# Patient Record
Sex: Female | Born: 1985 | Race: White | Hispanic: No | Marital: Married | Smoking: Never smoker
Health system: Southern US, Community
[De-identification: ages and names within clinical notes are randomized; demographics above are authoritative.]

## PROBLEM LIST (undated history)

## (undated) ENCOUNTER — Inpatient Hospital Stay (HOSPITAL_COMMUNITY): Payer: Self-pay

## (undated) HISTORY — PX: WISDOM TOOTH EXTRACTION: SHX21

---

## 2011-09-01 LAB — OB RESULTS CONSOLE GC/CHLAMYDIA
Chlamydia: NEGATIVE
Gonorrhea: NEGATIVE

## 2011-09-01 LAB — OB RESULTS CONSOLE ANTIBODY SCREEN: Antibody Screen: NEGATIVE

## 2011-09-01 LAB — OB RESULTS CONSOLE RUBELLA ANTIBODY, IGM: Rubella: IMMUNE

## 2011-09-01 LAB — OB RESULTS CONSOLE RPR: RPR: NONREACTIVE

## 2011-09-01 LAB — OB RESULTS CONSOLE HEPATITIS B SURFACE ANTIGEN: Hepatitis B Surface Ag: NEGATIVE

## 2011-09-01 LAB — OB RESULTS CONSOLE HIV ANTIBODY (ROUTINE TESTING): HIV: NONREACTIVE

## 2011-09-29 NOTE — L&D Delivery Note (Signed)
Delivery Note Called for precipitous delivery, pt rolled over and fetal vtx presented.  SVD and placenta attended by faculty.  I did repair.  SVD viable female Apgars 9,9 over 2nd degree lac.  Placenta delivered spontaneously intact with 3VC. Repair with 2-0 Chromic with good support and hemostasis noted and R/V exam confirms.  PH art was not done.  [place] cord blood was not done.  Mother and baby were doing well.  EBL 350cc  Candice Camp, MD

## 2011-12-21 ENCOUNTER — Encounter (HOSPITAL_COMMUNITY): Payer: Self-pay | Admitting: *Deleted

## 2011-12-21 ENCOUNTER — Inpatient Hospital Stay (HOSPITAL_COMMUNITY): Payer: BC Managed Care – PPO

## 2011-12-21 ENCOUNTER — Inpatient Hospital Stay (HOSPITAL_COMMUNITY)
Admission: AD | Admit: 2011-12-21 | Discharge: 2011-12-21 | Disposition: A | Discharge status: Home / Self Care | Payer: BC Managed Care – PPO | Source: Ambulatory Visit | Attending: Obstetrics and Gynecology | Admitting: Obstetrics and Gynecology

## 2011-12-21 DIAGNOSIS — O4690 Antepartum hemorrhage, unspecified, unspecified trimester: Secondary | ICD-10-CM

## 2011-12-21 DIAGNOSIS — O209 Hemorrhage in early pregnancy, unspecified: Secondary | ICD-10-CM | POA: Insufficient documentation

## 2011-12-21 NOTE — Discharge Instructions (Signed)
Pelvic Rest Pelvic rest is sometimes recommended for women when:   The placenta is partially or completely covering the opening of the cervix (placenta previa).   There is bleeding between the uterine wall and the amniotic sac in the first trimester (subchorionic hemorrhage).   The cervix begins to open without labor starting (incompetent cervix, cervical insufficiency).   The labor is too early (preterm labor).  HOME CARE INSTRUCTIONS  Do not have sexual intercourse, stimulation, or an orgasm.   Do not use tampons, douche, or put anything in the vagina.   Do not lift anything over 10 pounds (4.5 kg).   Avoid strenuous activity or straining your pelvic muscles.  SEEK MEDICAL CARE IF:  You have any vaginal bleeding during pregnancy. Treat this as a potential emergency.   You have cramping pain felt low in the stomach (stronger than menstrual cramps).   You notice vaginal discharge (watery, mucus, or bloody).   You have a low, dull backache.   There are regular contractions or uterine tightening.  SEEK IMMEDIATE MEDICAL CARE IF: You have vaginal bleeding and have placenta previa.  Document Released: 01/09/2011 Document Revised: 09/03/2011 Document Reviewed: 01/09/2011 ExitCare Patient Information 2012 ExitCare, Vaginal Bleeding During Pregnancy, Second Trimester A small amount of bleeding (spotting) is relatively common in pregnancy. It usually stops on its own. There are many causes for bleeding or spotting in pregnancy. Some bleeding may be related to the pregnancy and some may not. Cramping with the bleeding is more serious and concerning. Tell your caregiver if you have any vaginal bleeding.  CAUSES   Infection, inflammation or growths on the cervix.   The placenta may partially or completely be covering the opening of the cervix inside the uterus.   The placenta may have separated from the uterus.   You may be having early/preterm labor.   The cervix is not strong  enough to keep a baby inside the uterus (cervical insufficiency).   Many tiny cysts in the uterus instead of pregnancy tissue (molar pregnancy)  SYMPTOMS   Vaginal spotting or bleeding with or without cramps.   Uterine contractions.   Abnormal vaginal discharge.   You may have spotting or spotting after having sexual intercourse.  DIAGNOSIS  To evaluate the pregnancy, your caregiver may:  Do a pelvic exam.   Take blood tests.   Do an ultrasound.  It is very important to follow your caregiver's instructions.  TREATMENT   Evaluation of the pregnancy with blood tests and ultrasound.   Bed rest (getting up to use the bathroom only).   Rho-gam immunization if the mother is Rh negative and the father is Rh positive.   If you are having uterine contractions, you may be given medication to stop the contractions.   If you have cervical insufficiency, you may have a suture placed in the cervix to close it.  HOME CARE INSTRUCTIONS   If your caregiver orders bed rest, you may need to make arrangements for the care of other children and for any other responsibilities. However, your caregiver may allow you to continue light activity.   Keep track of the number of pads you use each day and how soaked (saturated) they are. Write this down.   Do not use tampons. Do not douche.   Do not have sexual intercourse or orgasms until approved by your physician.   Save any tissue that you pass for your caregiver to see.   Take medicine for cramps only with your caregiver's permission.  Do not take aspirin because it can make you bleed.   Do not exercise, do any strenuous activities or heavy lifting without your caregiver's permission.  SEEK IMMEDIATE MEDICAL CARE IF:   You experience severe cramps in your stomach, back or belly (abdomen).   You have uterine contractions.   You have an oral temperature above 102 F (38.9 C), not controlled by medicine.   You develop chills.   You  pass large clots or tissue.   Your bleeding increases or you become light-headed, weak or have fainting episodes.   You have leaking or a gush of fluid from your vagina.  Document Released: 06/24/2005 Document Revised: 09/03/2011 Document Reviewed: 01/03/2009 [facility] Patient Information 2012 [email or web address].

## 2011-12-21 NOTE — MAU Note (Signed)
Had intercourse yesterday. Noted some blood in toilet this AM. Scant amount now and more brown in color.

## 2011-12-21 NOTE — MAU Provider Note (Signed)
Chief Complaint:  Vaginal Bleeding   HPI Patty [name] is  26 y.o. G1P0 at [redacted]w[redacted]d Onset at 0500 today of first episode of bright red bleeding per vagina which looked like a large amount in the commode. Now the bleeding is brownish black and enough to streak her peripad. Last intercourse yesterday afternoon without cramping or bleeding.  Denies contractions, abdominal cramping leakage of fluid. Good fetal movement.   Pregnancy Course: uncomplicated. States office scan for anatomy was normal. Blood type: A+  Past Medical History: History reviewed. No pertinent past medical history.  Past Surgical History: Past Surgical History  Procedure Date  . Wisdom tooth extraction     Family History: Family History  Problem Relation Age of Onset  . Hypertension Father     Social History: History  Substance Use Topics  . Smoking status: Never Smoker   . Smokeless tobacco: Never Used  . Alcohol Use: No    Allergies: No Known Allergies  Meds:  Prescriptions prior to admission  Medication Sig Dispense Refill  . Prenatal Vit-Fe Fumarate-FA (PRENATAL MULTIVITAMIN) TABS Take 1 tablet by mouth daily.             Physical Exam  Blood pressure 112/68, pulse 76, temperature 97.9 F (36.6 C), temperature source Oral, resp. rate 18, height 5\' 1"  (1.549 m), weight 63.504 kg (140 lb). GENERAL: Well-developed, well-nourished female in no acute distress.  ABDOMEN: Soft, nontender, nondistended, gravid.  EXTREMITIES: Nontender, no edema, 2+ distal pulses. SPECULUM EXAM: nod amt dark blood swabbed from vault, appears to come from os, cx clean CERVIX: ext ftp/ L/C/H  FHT: 140's, 10 bpm accels, no decels Contractions: none   Labs: No results found for this or any previous visit (from the past 24 hour(s)). Imaging:   US Ob Limited  12/21/2011  OBSTETRICAL ULTRASOUND: This exam was performed within a [facility]. The OB US report was generated in the AS system, and  faxed to the ordering physician.   This report is also available in [employer] and in the [employer]. See [facility] Obstetric US report. Placenta ant above os, Cx 3.74, AFV nl   Assessment/Plan:  G1 at 24 weeks 6 days Bleeding possibly from marginal separation, hemodynamically stable Discussed with Dr. Huntley Dec Home on bedrest and pelvic rest and followup in the office tomorrow.     Patty [name] 3/25/20139:33 AM

## 2012-03-10 LAB — OB RESULTS CONSOLE GBS: GBS: NEGATIVE

## 2012-04-06 ENCOUNTER — Encounter (HOSPITAL_COMMUNITY): Payer: Self-pay | Admitting: *Deleted

## 2012-04-06 ENCOUNTER — Inpatient Hospital Stay (HOSPITAL_COMMUNITY)
Admission: AD | Admit: 2012-04-06 | Discharge: 2012-04-08 | DRG: 373 | Disposition: A | Discharge status: Home / Self Care | Payer: BC Managed Care – PPO | Source: Ambulatory Visit | Attending: Obstetrics and Gynecology | Admitting: Obstetrics and Gynecology

## 2012-04-06 LAB — CBC
HCT: 39.7 % (ref 36.0–46.0)
RDW: 14.1 % (ref 11.5–15.5)
WBC: 12.1 10*3/uL — ABNORMAL HIGH (ref 4.0–10.5)

## 2012-04-06 MED ORDER — PRENATAL MULTIVITAMIN CH: 1.0000 | ORAL_TABLET | Freq: Every day | ORAL | Status: DC

## 2012-04-06 MED ORDER — OXYCODONE-ACETAMINOPHEN 5-325 MG PO TABS: 1.0000 | ORAL_TABLET | ORAL | Status: DC | PRN

## 2012-04-06 MED ORDER — ONDANSETRON HCL 4 MG/2ML IJ SOLN: 4.0000 mg | Freq: Four times a day (QID) | INTRAMUSCULAR | Status: DC | PRN

## 2012-04-06 MED ORDER — BENZOCAINE-MENTHOL 20-0.5 % EX AERO: 1.0000 "application " | INHALATION_SPRAY | CUTANEOUS | Status: DC | PRN

## 2012-04-06 MED ORDER — TETANUS-DIPHTH-ACELL PERTUSSIS 5-2.5-18.5 LF-MCG/0.5 IM SUSP: 0.5000 mL | Freq: Once | INTRAMUSCULAR | Status: DC

## 2012-04-06 MED ORDER — ONDANSETRON HCL 4 MG/2ML IJ SOLN: 4.0000 mg | INTRAMUSCULAR | Status: DC | PRN

## 2012-04-06 MED ORDER — ZOLPIDEM TARTRATE 5 MG PO TABS: 5.0000 mg | ORAL_TABLET | Freq: Every evening | ORAL | Status: DC | PRN

## 2012-04-06 MED ORDER — IBUPROFEN 600 MG PO TABS
600.0000 mg | ORAL_TABLET | Freq: Four times a day (QID) | ORAL | Status: DC
  Administered 2012-04-06 – 2012-04-08 (×6): 600 mg via ORAL
  Filled 2012-04-06 (×6): qty 1

## 2012-04-06 MED ORDER — ONDANSETRON HCL 4 MG PO TABS: 4.0000 mg | ORAL_TABLET | ORAL | Status: DC | PRN

## 2012-04-06 MED ORDER — SENNOSIDES-DOCUSATE SODIUM 8.6-50 MG PO TABS
2.0000 | ORAL_TABLET | Freq: Every day | ORAL | Status: DC
  Administered 2012-04-07: 2 via ORAL

## 2012-04-06 MED ORDER — CITRIC ACID-SODIUM CITRATE 334-500 MG/5ML PO SOLN: 30.0000 mL | ORAL | Status: DC | PRN

## 2012-04-06 MED ORDER — LACTATED RINGERS IV SOLN: 500.0000 mL | INTRAVENOUS | Status: DC | PRN

## 2012-04-06 MED ORDER — OXYTOCIN BOLUS FROM INFUSION
250.0000 mL | Freq: Once | INTRAVENOUS | Status: DC
  Filled 2012-04-06: qty 500

## 2012-04-06 MED ORDER — LIDOCAINE HCL (PF) 1 % IJ SOLN
30.0000 mL | INTRAMUSCULAR | Status: DC | PRN
  Administered 2012-04-06: 30 mL via SUBCUTANEOUS
  Filled 2012-04-06: qty 30

## 2012-04-06 MED ORDER — ACETAMINOPHEN 325 MG PO TABS: 650.0000 mg | ORAL_TABLET | ORAL | Status: DC | PRN

## 2012-04-06 MED ORDER — MEASLES, MUMPS & RUBELLA VAC ~~LOC~~ INJ: 0.5000 mL | INJECTION | Freq: Once | SUBCUTANEOUS | Status: DC

## 2012-04-06 MED ORDER — FLEET ENEMA 7-19 GM/118ML RE ENEM: 1.0000 | ENEMA | RECTAL | Status: DC | PRN

## 2012-04-06 MED ORDER — SIMETHICONE 80 MG PO CHEW: 80.0000 mg | CHEWABLE_TABLET | ORAL | Status: DC | PRN

## 2012-04-06 MED ORDER — PRENATAL MULTIVITAMIN CH
1.0000 | ORAL_TABLET | Freq: Every day | ORAL | Status: DC
  Administered 2012-04-07: 1 via ORAL
  Filled 2012-04-06: qty 1

## 2012-04-06 MED ORDER — WITCH HAZEL-GLYCERIN EX PADS: 1.0000 "application " | MEDICATED_PAD | CUTANEOUS | Status: DC | PRN

## 2012-04-06 MED ORDER — OXYTOCIN 40 UNITS IN LACTATED RINGERS INFUSION - SIMPLE MED
62.5000 mL/h | Freq: Once | INTRAVENOUS | Status: DC
  Filled 2012-04-06: qty 1000

## 2012-04-06 MED ORDER — LANOLIN HYDROUS EX OINT: TOPICAL_OINTMENT | CUTANEOUS | Status: DC | PRN

## 2012-04-06 MED ORDER — LACTATED RINGERS IV SOLN
INTRAVENOUS | Status: DC
  Administered 2012-04-06: 17:00:00 via INTRAVENOUS

## 2012-04-06 MED ORDER — DIPHENHYDRAMINE HCL 25 MG PO CAPS: 25.0000 mg | ORAL_CAPSULE | Freq: Four times a day (QID) | ORAL | Status: DC | PRN

## 2012-04-06 MED ORDER — DIBUCAINE 1 % RE OINT: 1.0000 "application " | TOPICAL_OINTMENT | RECTAL | Status: DC | PRN

## 2012-04-06 MED ORDER — IBUPROFEN 600 MG PO TABS: 600.0000 mg | ORAL_TABLET | Freq: Four times a day (QID) | ORAL | Status: DC | PRN

## 2012-04-06 MED ORDER — MEDROXYPROGESTERONE ACETATE 150 MG/ML IM SUSP: 150.0000 mg | INTRAMUSCULAR | Status: DC | PRN

## 2012-04-06 NOTE — Progress Notes (Signed)
Patient ID: Patty [name], female   DOB: 09/13/1986, 26 y.o.   MRN: [id number]  Called to patient bedside for rapid progression of labor.  Patient complete and +3 station when I arrived.  Baby delivered to mother's abdomen in 1 push with next contraction.  Cord clamped and cut.  Placenta delivered intact shortly afterwards.  Dr Rana Snare arrived within a few minutes and assumed care.  Levie Heritage, DO 04/06/2012 7:00 PM

## 2012-04-06 NOTE — Progress Notes (Signed)
Delivery of infant by Dr. Adrian Blackwater

## 2012-04-06 NOTE — Progress Notes (Signed)
Pt returning from the bathroom stating that she had vomited and no longer felt like walking.

## 2012-04-06 NOTE — H&P (Signed)
Patty [name] is a 26 y.o. female presenting for labor sxs,  Ctxs every 3-5 minutes and now cervix 6 cm Pregnancy uncomplicated. History OB History    Grav Para Term Preterm Abortions TAB SAB Ect Mult Living   1 0 0 0 0 0 0 0 0 0      History reviewed. No pertinent past medical history. Past Surgical History  Procedure Date  . Wisdom tooth extraction    Family History: family history includes Hypertension in her father. Social History:  reports that she has never smoked. She has never used smokeless tobacco. She reports that she does not drink alcohol or use illicit drugs.   Prenatal Transfer Tool  Maternal Diabetes: No Genetic Screening: Normal Maternal Ultrasounds/Referrals: Normal Fetal Ultrasounds or other Referrals:  None Maternal Substance Abuse:  No Significant Maternal Medications:  None Significant Maternal Lab Results:  None Other Comments:  None  ROS  Dilation: 6 Effacement (%): 100 Station: -2 Exam by:: Dorrene German RN Blood pressure 137/89, pulse 63, temperature 98.1 F (36.7 [name]), temperature source Oral, resp. rate 20, height 5' 0.5" (1.537 m), weight 71.759 kg (158 lb 3.2 oz), SpO2 100.00%. Exam Physical Exam  Prenatal labs: ABO, Rh: A/Positive/-- (12/04 0000) Antibody: Negative (12/04 0000) Rubella: Immune (12/04 0000) RPR: Nonreactive (12/04 0000)  HBsAg: Negative (12/04 0000)  HIV: Non-reactive (12/04 0000)  GBS: Negative (06/13 0000)   Assessment/Plan: IUP at term in active labor Plan expectant management and anticipate SVD   Patty [name] 04/06/2012, 4:52 PM

## 2012-04-06 NOTE — MAU Note (Signed)
Patient states she has been having contractions every 4-6 minutes. Denies any bleeding or leaking and reports good fetal movement.

## 2012-04-07 LAB — CBC
HCT: 35.9 % — ABNORMAL LOW (ref 36.0–46.0)
Hemoglobin: 11.9 g/dL — ABNORMAL LOW (ref 12.0–15.0)
WBC: 15.2 10*3/uL — ABNORMAL HIGH (ref 4.0–10.5)

## 2012-04-07 LAB — RPR: RPR Ser Ql: NONREACTIVE

## 2012-04-07 NOTE — Progress Notes (Signed)
Post Partum Day 1 Subjective: no complaints, up ad lib, voiding and tolerating PO  Objective: Blood pressure 117/75, pulse 74, temperature 97.4 F (36.3 C), temperature source Oral, resp. rate 20, height 5' 0.5" (1.537 m), weight 71.759 kg (158 lb 3.2 oz), SpO2 99.00%, unknown if currently breastfeeding.  Physical Exam:  General: alert and cooperative Lochia: appropriate Uterine Fundus: firm Incision: perineum intact DVT Evaluation: No evidence of DVT seen on physical exam.   Basename 04/07/12 0510 04/06/12 1650  HGB 11.9* 13.6  HCT 35.9* 39.7    Assessment/Plan: Plan for discharge tomorrow   LOS: 1 day   Patty [name] G 04/07/2012, 7:54 AM

## 2012-04-08 MED ORDER — IBUPROFEN 600 MG PO TABS: 600.0000 mg | ORAL_TABLET | Freq: Four times a day (QID) | ORAL | Status: AC

## 2012-04-08 NOTE — Discharge Summary (Signed)
Obstetric Discharge Summary Reason for Admission: onset of labor Prenatal Procedures: ultrasound Intrapartum Procedures: spontaneous vaginal delivery Postpartum Procedures: none Complications-Operative and Postpartum: 2 degree perineal laceration Hemoglobin  Date Value Range Status  04/07/2012 11.9* 12.0 - 15.0 g/dL Final     HCT  Date Value Range Status  04/07/2012 35.9* 36.0 - 46.0 % Final    Physical Exam:  General: alert and cooperative Lochia: appropriate Uterine Fundus: firm Incision: perineum intact DVT Evaluation: No evidence of DVT seen on physical exam.  Discharge Diagnoses: Term Pregnancy-delivered  Discharge Information: Date: 04/08/2012 Activity: pelvic rest Diet: routine Medications: PNV and Ibuprofen Condition: stable Instructions: refer to practice specific booklet Discharge to: home   Newborn Data: Live born female  Birth Weight: 6 lb 10.7 oz (3025 g) APGAR: 9, 9  Home with mother.  Patty [name] G 04/08/2012, 8:00 AM

## 2012-04-08 NOTE — Progress Notes (Signed)
Post Partum Day 2 Subjective: no complaints, up ad lib, voiding, tolerating PO and + flatus  Objective: Blood pressure 111/74, pulse 85, temperature 98.1 F (36.7 C), temperature source Oral, resp. rate 18, height 5' 0.5" (1.537 m), weight 71.759 kg (158 lb 3.2 oz), SpO2 99.00%, unknown if currently breastfeeding.  Physical Exam:  General: alert and cooperative Lochia: appropriate Uterine Fundus: firm Incision: perineum intact DVT Evaluation: No evidence of DVT seen on physical exam.   Basename 04/07/12 0510 04/06/12 1650  HGB 11.9* 13.6  HCT 35.9* 39.7    Assessment/Plan: Discharge home   LOS: 2 days   Merilyn Pagan G 04/08/2012, 7:53 AM

## 2014-02-06 LAB — OB RESULTS CONSOLE GC/CHLAMYDIA
Chlamydia: NEGATIVE
Gonorrhea: NEGATIVE

## 2014-02-06 LAB — OB RESULTS CONSOLE ABO/RH: RH Type: POSITIVE

## 2014-02-06 LAB — OB RESULTS CONSOLE HIV ANTIBODY (ROUTINE TESTING): HIV: NONREACTIVE

## 2014-02-06 LAB — OB RESULTS CONSOLE RUBELLA ANTIBODY, IGM: RUBELLA: IMMUNE

## 2014-02-06 LAB — OB RESULTS CONSOLE RPR: RPR: NONREACTIVE

## 2014-02-06 LAB — OB RESULTS CONSOLE HEPATITIS B SURFACE ANTIGEN: HEP B S AG: NEGATIVE

## 2014-02-06 LAB — OB RESULTS CONSOLE ANTIBODY SCREEN: ANTIBODY SCREEN: NEGATIVE

## 2014-02-24 ENCOUNTER — Inpatient Hospital Stay (HOSPITAL_COMMUNITY)
Admission: AD | Admit: 2014-02-24 | Payer: BC Managed Care – PPO | Source: Ambulatory Visit | Admitting: Obstetrics and Gynecology

## 2014-07-30 ENCOUNTER — Encounter (HOSPITAL_COMMUNITY): Payer: Self-pay | Admitting: *Deleted

## 2014-08-05 LAB — OB RESULTS CONSOLE GBS: STREP GROUP B AG: POSITIVE

## 2014-08-27 ENCOUNTER — Encounter (HOSPITAL_COMMUNITY): Payer: Self-pay

## 2014-08-27 ENCOUNTER — Inpatient Hospital Stay (HOSPITAL_COMMUNITY)
Admission: AD | Admit: 2014-08-27 | Discharge: 2014-08-28 | DRG: 775 | Disposition: A | Discharge status: Home / Self Care | Payer: BC Managed Care – PPO | Source: Ambulatory Visit | Attending: Obstetrics and Gynecology | Admitting: Obstetrics and Gynecology

## 2014-08-27 DIAGNOSIS — O99824 Streptococcus B carrier state complicating childbirth: Principal | ICD-10-CM | POA: Diagnosis present

## 2014-08-27 DIAGNOSIS — Z3A38 38 weeks gestation of pregnancy: Secondary | ICD-10-CM | POA: Diagnosis present

## 2014-08-27 DIAGNOSIS — Z3480 Encounter for supervision of other normal pregnancy, unspecified trimester: Secondary | ICD-10-CM | POA: Diagnosis present

## 2014-08-27 LAB — CBC
HCT: 37.3 % (ref 36.0–46.0)
Hemoglobin: 12.8 g/dL (ref 12.0–15.0)
MCH: 31.4 pg (ref 26.0–34.0)
MCHC: 34.3 g/dL (ref 30.0–36.0)
MCV: 91.4 fL (ref 78.0–100.0)
PLATELETS: 159 10*3/uL (ref 150–400)
RBC: 4.08 MIL/uL (ref 3.87–5.11)
RDW: 12.6 % (ref 11.5–15.5)
WBC: 8.8 10*3/uL (ref 4.0–10.5)

## 2014-08-27 LAB — POCT FERN TEST: POCT Fern Test: POSITIVE

## 2014-08-27 LAB — TYPE AND SCREEN
ABO/RH(D): A POS
Antibody Screen: NEGATIVE

## 2014-08-27 LAB — ABO/RH: ABO/RH(D): A POS

## 2014-08-27 LAB — RPR

## 2014-08-27 MED ORDER — PHENYLEPHRINE 40 MCG/ML (10ML) SYRINGE FOR IV PUSH (FOR BLOOD PRESSURE SUPPORT): 80.0000 ug | PREFILLED_SYRINGE | INTRAVENOUS | Status: DC | PRN

## 2014-08-27 MED ORDER — OXYTOCIN 10 UNIT/ML IJ SOLN
40.0000 [IU] | INTRAVENOUS | Status: DC
  Filled 2014-08-27: qty 4

## 2014-08-27 MED ORDER — PENICILLIN G POTASSIUM 5000000 UNITS IJ SOLR
5.0000 10*6.[IU] | Freq: Once | INTRAVENOUS | Status: AC
  Administered 2014-08-27: 5 10*6.[IU] via INTRAVENOUS
  Filled 2014-08-27: qty 5

## 2014-08-27 MED ORDER — LACTATED RINGERS IV SOLN
INTRAVENOUS | Status: DC
  Administered 2014-08-27: 06:00:00 via INTRAVENOUS

## 2014-08-27 MED ORDER — CITRIC ACID-SODIUM CITRATE 334-500 MG/5ML PO SOLN: 30.0000 mL | ORAL | Status: DC | PRN

## 2014-08-27 MED ORDER — IBUPROFEN 600 MG PO TABS
600.0000 mg | ORAL_TABLET | Freq: Four times a day (QID) | ORAL | Status: DC
  Administered 2014-08-27 – 2014-08-28 (×4): 600 mg via ORAL
  Filled 2014-08-27 (×3): qty 1

## 2014-08-27 MED ORDER — OXYCODONE-ACETAMINOPHEN 5-325 MG PO TABS: 2.0000 | ORAL_TABLET | ORAL | Status: DC | PRN

## 2014-08-27 MED ORDER — DIBUCAINE 1 % RE OINT: 1.0000 "application " | TOPICAL_OINTMENT | RECTAL | Status: DC | PRN

## 2014-08-27 MED ORDER — DIPHENHYDRAMINE HCL 50 MG/ML IJ SOLN
12.5000 mg | INTRAMUSCULAR | Status: DC | PRN
Start: 2014-08-27 — End: 2014-08-27

## 2014-08-27 MED ORDER — EPHEDRINE 5 MG/ML INJ: 10.0000 mg | INTRAVENOUS | Status: DC | PRN

## 2014-08-27 MED ORDER — SENNOSIDES-DOCUSATE SODIUM 8.6-50 MG PO TABS
2.0000 | ORAL_TABLET | ORAL | Status: DC
  Administered 2014-08-28: 2 via ORAL
  Filled 2014-08-27: qty 2

## 2014-08-27 MED ORDER — LACTATED RINGERS IV SOLN: 500.0000 mL | INTRAVENOUS | Status: DC | PRN

## 2014-08-27 MED ORDER — DIPHENHYDRAMINE HCL 25 MG PO CAPS: 25.0000 mg | ORAL_CAPSULE | Freq: Four times a day (QID) | ORAL | Status: DC | PRN

## 2014-08-27 MED ORDER — FENTANYL 2.5 MCG/ML BUPIVACAINE 1/10 % EPIDURAL INFUSION (WH - ANES): 14.0000 mL/h | INTRAMUSCULAR | Status: DC | PRN

## 2014-08-27 MED ORDER — LIDOCAINE HCL (PF) 1 % IJ SOLN
30.0000 mL | Freq: Once | INTRAMUSCULAR | Status: AC
  Administered 2014-08-27: 30 mL

## 2014-08-27 MED ORDER — PRENATAL MULTIVITAMIN CH
1.0000 | ORAL_TABLET | Freq: Every day | ORAL | Status: DC
Start: 2014-08-27 — End: 2014-08-28
  Administered 2014-08-28: 1 via ORAL

## 2014-08-27 MED ORDER — ACETAMINOPHEN 325 MG PO TABS: 650.0000 mg | ORAL_TABLET | ORAL | Status: DC | PRN

## 2014-08-27 MED ORDER — ONDANSETRON HCL 4 MG/2ML IJ SOLN: 4.0000 mg | INTRAMUSCULAR | Status: DC | PRN

## 2014-08-27 MED ORDER — FLEET ENEMA 7-19 GM/118ML RE ENEM: 1.0000 | ENEMA | RECTAL | Status: DC | PRN

## 2014-08-27 MED ORDER — MEDROXYPROGESTERONE ACETATE 150 MG/ML IM SUSP: 150.0000 mg | INTRAMUSCULAR | Status: DC | PRN

## 2014-08-27 MED ORDER — LACTATED RINGERS IV SOLN: 500.0000 mL | Freq: Once | INTRAVENOUS | Status: DC

## 2014-08-27 MED ORDER — ONDANSETRON HCL 4 MG PO TABS: 4.0000 mg | ORAL_TABLET | ORAL | Status: DC | PRN

## 2014-08-27 MED ORDER — SIMETHICONE 80 MG PO CHEW: 80.0000 mg | CHEWABLE_TABLET | ORAL | Status: DC | PRN

## 2014-08-27 MED ORDER — ONDANSETRON HCL 4 MG/2ML IJ SOLN: 4.0000 mg | Freq: Four times a day (QID) | INTRAMUSCULAR | Status: DC | PRN

## 2014-08-27 MED ORDER — OXYTOCIN BOLUS FROM INFUSION: 500.0000 mL | INTRAVENOUS | Status: DC

## 2014-08-27 MED ORDER — OXYCODONE-ACETAMINOPHEN 5-325 MG PO TABS: 1.0000 | ORAL_TABLET | ORAL | Status: DC | PRN

## 2014-08-27 MED ORDER — WITCH HAZEL-GLYCERIN EX PADS: 1.0000 "application " | MEDICATED_PAD | CUTANEOUS | Status: DC | PRN

## 2014-08-27 MED ORDER — BENZOCAINE-MENTHOL 20-0.5 % EX AERO
1.0000 "application " | INHALATION_SPRAY | CUTANEOUS | Status: DC | PRN
  Administered 2014-08-28: 1 via TOPICAL
  Filled 2014-08-27: qty 56

## 2014-08-27 MED ORDER — LANOLIN HYDROUS EX OINT: TOPICAL_OINTMENT | CUTANEOUS | Status: DC | PRN

## 2014-08-27 MED ORDER — OXYTOCIN 40 UNITS IN LACTATED RINGERS INFUSION - SIMPLE MED
62.5000 mL/h | INTRAVENOUS | Status: DC
  Filled 2014-08-27: qty 1000

## 2014-08-27 MED ORDER — MEASLES, MUMPS & RUBELLA VAC ~~LOC~~ INJ: 0.5000 mL | INJECTION | Freq: Once | SUBCUTANEOUS | Status: DC

## 2014-08-27 MED ORDER — PENICILLIN G POTASSIUM 5000000 UNITS IJ SOLR
2.5000 10*6.[IU] | INTRAVENOUS | Status: DC
  Administered 2014-08-27: 2.5 10*6.[IU] via INTRAVENOUS
  Filled 2014-08-27 (×2): qty 2.5

## 2014-08-27 MED ORDER — LIDOCAINE HCL (PF) 1 % IJ SOLN
30.0000 mL | INTRAMUSCULAR | Status: DC | PRN
  Filled 2014-08-27: qty 30

## 2014-08-27 MED ORDER — TETANUS-DIPHTH-ACELL PERTUSSIS 5-2.5-18.5 LF-MCG/0.5 IM SUSP: 0.5000 mL | Freq: Once | INTRAMUSCULAR | Status: DC

## 2014-08-27 MED ORDER — OXYTOCIN 10 UNIT/ML IJ SOLN
40.0000 [IU] | Freq: Once | INTRAVENOUS | Status: DC
  Filled 2014-08-27: qty 4

## 2014-08-27 NOTE — Plan of Care (Signed)
Problem: Phase I Progression Outcomes Goal: Maternal risk factors reviewed Outcome: Completed/Met Date Met:  08/27/14     

## 2014-08-27 NOTE — Plan of Care (Signed)
Problem: Phase I Progression Outcomes Goal: Activity/symmetrical movement Outcome: Completed/Met Date Met:  08/27/14

## 2014-08-27 NOTE — Plan of Care (Signed)
Problem: Phase I Progression Outcomes Goal: Initial discharge plan identified Outcome: Completed/Met Date Met:  08/27/14

## 2014-08-27 NOTE — Plan of Care (Signed)
Problem: Phase I Progression Outcomes Goal: Foley catheter patent Outcome: Not Applicable Date Met:  08/27/14     

## 2014-08-27 NOTE — Plan of Care (Signed)
Problem: Phase II Progression Outcomes Goal: Tolerating diet Outcome: Completed/Met Date Met:  08/27/14     

## 2014-08-27 NOTE — Progress Notes (Signed)
Precipitous svd of viable female. Upon my arrival, infant on mom's chest I clamped and cut cord and placenta delivered S/I/3VC 1st degree lacs repaired with 3-0 vicryl Fundus firm, hemostatic Mom and baby stable in couplet care

## 2014-08-27 NOTE — MAU Note (Signed)
Pt c/o LOF at 0330, and she began ctx q 5 thereafter, denies VB, +FM.

## 2014-08-27 NOTE — Plan of Care (Signed)
Problem: Phase I Progression Outcomes Goal: OOB as tolerated unless otherwise ordered Outcome: Completed/Met Date Met:  08/27/14     

## 2014-08-27 NOTE — MAU Note (Signed)
Pt reports ROM at 0330

## 2014-08-27 NOTE — Plan of Care (Signed)
Problem: Phase II Progression Outcomes Goal: Rh isoimmunization per orders Outcome: Not Applicable Date Met:  08/27/14     

## 2014-08-27 NOTE — Plan of Care (Signed)
Problem: Phase II Progression Outcomes Goal: Progress activity as tolerated unless otherwise ordered Outcome: Completed/Met Date Met:  08/27/14

## 2014-08-27 NOTE — Lactation Note (Signed)
This note was copied from the chart of Patty Roderic Ovensaroline Portocarrero. Lactation Consultation Note  P2, Ex BF 14 months. Baby STS after bath. Mother states she knows how to hand express and breastfeeding is going well. Denies problems or questions. Discussed cluster feeding. Mom encouraged to feed baby 8-12 times/24 hours and with feeding cues.  Mom made aware of O/P services, breastfeeding support groups, community resources, and our phone # for post-discharge questions.   Patient Name: Patty [name] ZOXWR'UToday's Date: 08/27/2014 Reason for consult: Initial assessment   Maternal Data Has patient been taught Hand Expression?: Yes Does the patient have breastfeeding experience prior to this delivery?: Yes  Feeding Feeding Type: Breast Fed Length of feed: 0 min  LATCH Score/Interventions                      Lactation Tools Discussed/Used     Consult Status Consult Status: Follow-up Date: 08/28/14 Follow-up type: In-patient    Dahlia ByesBerkelhammer, Ruth [facility] 08/27/2014, 9:08 PM

## 2014-08-27 NOTE — H&P (Signed)
Patty [name] is a [age] [address].o. female presenting for SROM/labor.  Pt reports ctx are getting stronger and closer together  History OB History    Gravida Para Term Preterm AB TAB SAB Ectopic Multiple Living   2 1 1  0 0 0 0 0 0 1     History reviewed. No pertinent past medical history. Past Surgical History  Procedure Laterality Date  . Wisdom tooth extraction     Family History: family history includes Hypertension in her father. Social History:  reports that she has never smoked. She has never used smokeless tobacco. She reports that she does not drink alcohol or use illicit drugs.   Prenatal Transfer Tool  Maternal Diabetes: No Genetic Screening: Declined Maternal Ultrasounds/Referrals: Normal Fetal Ultrasounds or other Referrals:  None Maternal Substance Abuse:  No Significant Maternal Medications:  None Significant Maternal Lab Results:  None Other Comments:  None  ROS  Dilation: 4.5 Effacement (%): 60 Station: -2 Exam by:: S Nix RN Blood pressure 132/79, pulse 71, temperature 99.6 F (37.6 C), temperature source Axillary, resp. rate 18, height 5\' 1"  (1.549 m), weight 70.761 kg (156 lb), unknown if currently breastfeeding. Exam Physical Exam  Gen - uncomfortable w/ ctx Abd - gravid, NT Ext - NT Cvx 5/90/-1  Prenatal labs: ABO, Rh: --/--/A POS (11/30 0540) Antibody: NEG (11/30 0540) Rubella: Immune (05/12 0000) RPR: Nonreactive (05/12 0000)  HBsAg: Negative (05/12 0000)  HIV: Non-reactive (05/12 0000)  GBS: Positive (11/08 0000)   Assessment/Plan: Admit Exp mngt Epidural prn   Taiz Bickle 08/27/2014, 8:33 AM

## 2014-08-27 NOTE — Plan of Care (Signed)
Problem: Phase I Progression Outcomes Goal: Voiding adequately Outcome: Completed/Met Date Met:  08/27/14     

## 2014-08-27 NOTE — Plan of Care (Signed)
Problem: Phase I Progression Outcomes Goal: Pain controlled with appropriate interventions Outcome: Completed/Met Date Met:  08/27/14 Goal: IS, TCDB as ordered Outcome: Not Applicable Date Met:  50/56/78 Goal: VS, stable, temp < 100.4 degrees F Outcome: Completed/Met Date Met:  08/27/14 Goal: Other Phase I Outcomes/Goals Outcome: Completed/Met Date Met:  08/27/14  Problem: Phase II Progression Outcomes Goal: Pain controlled on oral analgesia Outcome: Completed/Met Date Met:  08/27/14 Goal: Afebrile, VS remain stable Outcome: Completed/Met Date Met:  08/27/14 Goal: Incision intact & without signs/symptoms of infection Outcome: Not Applicable Date Met:  89/33/88 Goal: Other Phase II Outcomes/Goals Outcome: Completed/Met Date Met:  08/27/14

## 2014-08-28 LAB — CBC
HEMATOCRIT: 35.2 % — AB (ref 36.0–46.0)
Hemoglobin: 11.9 g/dL — ABNORMAL LOW (ref 12.0–15.0)
MCH: 31.4 pg (ref 26.0–34.0)
MCHC: 33.8 g/dL (ref 30.0–36.0)
MCV: 92.9 fL (ref 78.0–100.0)
Platelets: 115 10*3/uL — ABNORMAL LOW (ref 150–400)
RBC: 3.79 MIL/uL — ABNORMAL LOW (ref 3.87–5.11)
RDW: 12.8 % (ref 11.5–15.5)
WBC: 9.7 10*3/uL (ref 4.0–10.5)

## 2014-08-28 NOTE — Progress Notes (Signed)
Post Partum Day 1 Subjective: no complaints, up ad lib, voiding, tolerating PO and +BM  Objective: Blood pressure 117/71, pulse 70, temperature 97.8 F (36.6 C), temperature source Oral, resp. rate 19, height 5\' 1"  (1.549 m), weight 156 lb (70.761 kg), unknown if currently breastfeeding.  Physical Exam:  General: alert and cooperative Lochia: appropriate Uterine Fundus: firm Incision: healing well DVT Evaluation: No evidence of DVT seen on physical exam. Negative Homan's sign. No cords or calf tenderness. No significant calf/ankle edema.   Recent Labs  08/27/14 0540 08/28/14 0610  HGB 12.8 11.9*  HCT 37.3 35.2*    Assessment/Plan: Discharge home   LOS: 1 day   Jariah Tarkowski G 08/28/2014, 8:12 AM

## 2014-08-28 NOTE — Discharge Summary (Signed)
Obstetric Discharge Summary Reason for Admission: rupture of membranes Prenatal Procedures: ultrasound Intrapartum Procedures: spontaneous vaginal delivery Postpartum Procedures: none Complications-Operative and Postpartum: 1 degree perineal laceration HEMOGLOBIN  Date Value Ref Range Status  08/28/2014 11.9* 12.0 - 15.0 g/dL Final   HCT  Date Value Ref Range Status  08/28/2014 35.2* 36.0 - 46.0 % Final    Physical Exam:  General: alert and cooperative Lochia: appropriate Uterine Fundus: firm Incision: healing well DVT Evaluation: No evidence of DVT seen on physical exam. Negative Homan's sign. No cords or calf tenderness. No significant calf/ankle edema.  Discharge Diagnoses: Term Pregnancy-delivered  Discharge Information: Date: 08/28/2014 Activity: pelvic rest Diet: routine Medications: PNV and Ibuprofen Condition: stable Instructions: refer to practice specific booklet Discharge to: home   Newborn Data: Live born female  Birth Weight: 6 lb 2.4 oz (2790 g) APGAR: 9, 9  Home with mother.  Lonnie Reth G 08/28/2014, 8:16 AM

## 2014-08-28 NOTE — Plan of Care (Signed)
Problem: Discharge Progression Outcomes Goal: Barriers To Progression Addressed/Resolved Outcome: Completed/Met Date Met:  08/28/14 Goal: Activity appropriate for discharge plan Outcome: Completed/Met Date Met:  08/28/14 Goal: Tolerating diet Outcome: Completed/Met Date Met:  08/28/14 Goal: Complications resolved/controlled Outcome: Completed/Met Date Met:  08/28/14 Goal: Pain controlled with appropriate interventions Outcome: Completed/Met Date Met:  08/28/14 Goal: Afebrile, VS remain stable at discharge Outcome: Completed/Met Date Met:  08/28/14 Goal: Remove staples per MD order Outcome: Not Applicable Date Met:  08/28/14 Goal: MMR given as ordered Outcome: Not Applicable Date Met:  08/28/14 Goal: Discharge plan in place and appropriate Outcome: Completed/Met Date Met:  08/28/14 Goal: Other Discharge Outcomes/Goals Outcome: Completed/Met Date Met:  08/28/14     

## 2014-08-28 NOTE — Plan of Care (Signed)
Problem: Phase I Progression Outcomes Goal: Pain controlled with appropriate interventions Outcome: Not Applicable Date Met:  36/06/77 Goal: Initiate feedings Outcome: Completed/Met Date Met:  08/28/14 Goal: Initiate CBG protocol as appropriate Outcome: Not Applicable Date Met:  03/40/35 Goal: Newborn vital signs stable Outcome: Not Applicable Date Met:  24/81/85 Goal: Maintains temperature within newborn range Outcome: Completed/Met Date Met:  08/28/14 Goal: ABO/Rh ordered if indicated Outcome: Not Applicable Date Met:  90/93/11 Goal: Initial discharge plan identified Outcome: Completed/Met Date Met:  08/28/14

## 2020-03-25 ENCOUNTER — Ambulatory Visit (INDEPENDENT_AMBULATORY_CARE_PROVIDER_SITE_OTHER): Payer: BC Managed Care – PPO

## 2020-03-25 ENCOUNTER — Other Ambulatory Visit: Payer: Self-pay | Admitting: Podiatry

## 2020-03-25 ENCOUNTER — Other Ambulatory Visit: Payer: Self-pay

## 2020-03-25 ENCOUNTER — Ambulatory Visit: Payer: BC Managed Care – PPO | Admitting: Podiatry

## 2020-03-25 DIAGNOSIS — M79675 Pain in left toe(s): Secondary | ICD-10-CM | POA: Diagnosis not present

## 2020-03-25 DIAGNOSIS — M2012 Hallux valgus (acquired), left foot: Secondary | ICD-10-CM | POA: Diagnosis not present

## 2020-03-25 DIAGNOSIS — M21612 Bunion of left foot: Secondary | ICD-10-CM

## 2020-03-25 DIAGNOSIS — M2530 Other instability, unspecified joint: Secondary | ICD-10-CM

## 2020-03-25 DIAGNOSIS — M79671 Pain in right foot: Secondary | ICD-10-CM

## 2020-03-25 DIAGNOSIS — M2011 Hallux valgus (acquired), right foot: Secondary | ICD-10-CM

## 2020-03-25 MED ORDER — NEOMYCIN-POLYMYXIN-HC 3.5-10000-1 OT SOLN: OTIC | 0 refills | Status: DC

## 2020-03-25 NOTE — Progress Notes (Signed)
  Subjective:  Patient ID: Patty [name], female    DOB: July 29, 1986,  MRN: [id number]  No chief complaint on file.  34 y.o. female presents for chief complaints of bunions ot both feet, worse on the left side. Especially worse the last 6 weeks. Having pain and discomfort with all her shoes, even sandals, worse when she's walking. Tries to stay active. Works as a Runner, broadcasting/film/video.  Objective:  Physical Exam: warm, good capillary refill, no trophic changes or ulcerative lesions, normal DP and PT pulses and normal sensory exam. Hallux valgus bilat with gross hypermobility of the 1st TMT L>R. No POP today.   No images are attached to the encounter.  Radiographs: X-ray of both feet: no fracture, dislocation, swelling or degenerative changes noted Assessment:   1. Acquired hallux valgus of both feet   2. Joint instability   3. Pain of great toe, left   4. Hallux valgus with bunions, left    Plan:  Patient was evaluated and treated and all questions answered.  Hallux Valgus -XR Reviewed -Educated on etiology with patient.  -Dispense bunion cushion and toe spacer. -Will likely need Lapidus bunionectomy given hypermobility. -Patient is a Runner, broadcasting/film/video and is not interested in surgery at this time. -F/u PRN for further discussion.  No follow-ups on file.

## 2020-03-27 ENCOUNTER — Other Ambulatory Visit: Payer: Self-pay | Admitting: Podiatry

## 2020-03-27 DIAGNOSIS — M2011 Hallux valgus (acquired), right foot: Secondary | ICD-10-CM

## 2024-10-17 ENCOUNTER — Other Ambulatory Visit: Payer: Self-pay | Admitting: Obstetrics and Gynecology

## 2024-10-17 DIAGNOSIS — R928 Other abnormal and inconclusive findings on diagnostic imaging of breast: Secondary | ICD-10-CM

## 2024-10-19 ENCOUNTER — Ambulatory Visit
Admission: RE | Admit: 2024-10-19 | Discharge: 2024-10-19 | Disposition: A | Discharge status: Home / Self Care | Source: Ambulatory Visit | Attending: Obstetrics and Gynecology | Admitting: Obstetrics and Gynecology

## 2024-10-19 DIAGNOSIS — R928 Other abnormal and inconclusive findings on diagnostic imaging of breast: Secondary | ICD-10-CM

## 2024-10-24 ENCOUNTER — Other Ambulatory Visit: Payer: Self-pay

## 2024-10-30 ENCOUNTER — Encounter (HOSPITAL_BASED_OUTPATIENT_CLINIC_OR_DEPARTMENT_OTHER): Payer: Self-pay

## 2024-10-30 ENCOUNTER — Ambulatory Visit (INDEPENDENT_AMBULATORY_CARE_PROVIDER_SITE_OTHER): Admitting: Radiology

## 2024-10-30 ENCOUNTER — Ambulatory Visit (HOSPITAL_BASED_OUTPATIENT_CLINIC_OR_DEPARTMENT_OTHER)
Admission: RE | Admit: 2024-10-30 | Discharge: 2024-10-30 | Disposition: A | Discharge status: Home / Self Care | Source: Ambulatory Visit

## 2024-10-30 ENCOUNTER — Other Ambulatory Visit (HOSPITAL_BASED_OUTPATIENT_CLINIC_OR_DEPARTMENT_OTHER): Payer: Self-pay

## 2024-10-30 VITALS — BP 137/85 | HR 74 | Temp 98.3°F | Resp 16

## 2024-10-30 DIAGNOSIS — R0602 Shortness of breath: Secondary | ICD-10-CM

## 2024-10-30 DIAGNOSIS — R052 Subacute cough: Secondary | ICD-10-CM

## 2024-10-30 DIAGNOSIS — R0989 Other specified symptoms and signs involving the circulatory and respiratory systems: Secondary | ICD-10-CM

## 2024-10-30 DIAGNOSIS — J329 Chronic sinusitis, unspecified: Secondary | ICD-10-CM

## 2024-10-30 DIAGNOSIS — J40 Bronchitis, not specified as acute or chronic: Secondary | ICD-10-CM

## 2024-10-30 MED ORDER — ALBUTEROL SULFATE HFA 108 (90 BASE) MCG/ACT IN AERS
2.0000 | INHALATION_SPRAY | Freq: Once | RESPIRATORY_TRACT | Status: AC
  Administered 2024-10-30: 2 via RESPIRATORY_TRACT

## 2024-10-30 MED ORDER — DOXYCYCLINE HYCLATE 100 MG PO CAPS: 100.0000 mg | ORAL_CAPSULE | Freq: Two times a day (BID) | ORAL | 0 refills | Status: DC

## 2024-10-30 MED ORDER — DOXYCYCLINE HYCLATE 100 MG PO CAPS
100.0000 mg | ORAL_CAPSULE | Freq: Two times a day (BID) | ORAL | 0 refills | Status: AC
  Filled 2024-10-30: qty 20, 10d supply, fill #0

## 2024-10-30 MED ORDER — PROMETHAZINE-DM 6.25-15 MG/5ML PO SYRP
5.0000 mL | ORAL_SOLUTION | Freq: Three times a day (TID) | ORAL | 0 refills | Status: AC | PRN
  Filled 2024-10-30: qty 118, 8d supply, fill #0

## 2024-10-30 MED ORDER — PREDNISONE 20 MG PO TABS: 40.0000 mg | ORAL_TABLET | Freq: Every day | ORAL | 0 refills | Status: DC

## 2024-10-30 MED ORDER — PREDNISONE 20 MG PO TABS
40.0000 mg | ORAL_TABLET | Freq: Every day | ORAL | 0 refills | Status: AC
  Filled 2024-10-30: qty 10, 5d supply, fill #0

## 2024-10-30 MED ORDER — PROMETHAZINE-DM 6.25-15 MG/5ML PO SYRP: 5.0000 mL | ORAL_SOLUTION | Freq: Three times a day (TID) | ORAL | 0 refills | Status: DC | PRN

## 2024-10-30 NOTE — ED Triage Notes (Signed)
 Patient here today with c/o cough X 3 weeks. Patient has been having some SOB and congestion. Patient has taken Amoxicillin with no relief. Patient has also been taking Mucinex and has been helping with the mucous. Denies lung issues, No known sick contacts.

## 2024-10-30 NOTE — Discharge Instructions (Addendum)
 Your x-ray did not show any evidence of pneumonia.  We are going to cover with a different antibiotic known as doxycycline  100 mg twice daily for 10 days.  Stay out of the sun while on this medication as it can cause you to have a bad sunburn.  Use the albuterol  inhaler every 4-6 hours as needed.  Start prednisone  40 mg for 5 days.  Do not take NSAIDs with this medication including aspirin, ibuprofen /Advil , naproxen/Aleve.  You can use Mucinex, Flonase, nasal saline/sinus rinses for additional symptom relief.  Use Promethazine  DM for cough.  This will make you sleepy so do not drive or drink alcohol while taking it.  If you are not feeling better within a few days please return for reevaluation.  If anything worsens you have high fever, worsening cough, shortness of breath, chest pain despite the medication you should be seen immediately.
